# Patient Record
Sex: Male | Born: 1960 | Race: White | Hispanic: No | Marital: Married | Smoking: Never smoker
Health system: Southern US, Community
[De-identification: ages and names within clinical notes are randomized; demographics above are authoritative.]

## PROBLEM LIST (undated history)

## (undated) DIAGNOSIS — R310 Gross hematuria: Secondary | ICD-10-CM

## (undated) DIAGNOSIS — R3915 Urgency of urination: Secondary | ICD-10-CM

## (undated) DIAGNOSIS — Z6832 Body mass index (BMI) 32.0-32.9, adult: Secondary | ICD-10-CM

## (undated) DIAGNOSIS — I10 Essential (primary) hypertension: Secondary | ICD-10-CM

## (undated) DIAGNOSIS — R6882 Decreased libido: Secondary | ICD-10-CM

## (undated) DIAGNOSIS — R509 Fever, unspecified: Secondary | ICD-10-CM

## (undated) DIAGNOSIS — E871 Hypo-osmolality and hyponatremia: Secondary | ICD-10-CM

## (undated) DIAGNOSIS — R339 Retention of urine, unspecified: Secondary | ICD-10-CM

## (undated) DIAGNOSIS — R7309 Other abnormal glucose: Secondary | ICD-10-CM

## (undated) DIAGNOSIS — E538 Deficiency of other specified B group vitamins: Secondary | ICD-10-CM

## (undated) DIAGNOSIS — E291 Testicular hypofunction: Secondary | ICD-10-CM

## (undated) DIAGNOSIS — N138 Other obstructive and reflux uropathy: Secondary | ICD-10-CM

## (undated) DIAGNOSIS — R972 Elevated prostate specific antigen [PSA]: Secondary | ICD-10-CM

## (undated) DIAGNOSIS — R0609 Other forms of dyspnea: Secondary | ICD-10-CM

## (undated) DIAGNOSIS — R351 Nocturia: Secondary | ICD-10-CM

## (undated) DIAGNOSIS — R300 Dysuria: Secondary | ICD-10-CM

## (undated) DIAGNOSIS — G4733 Obstructive sleep apnea (adult) (pediatric): Secondary | ICD-10-CM

## (undated) DIAGNOSIS — N320 Bladder-neck obstruction: Secondary | ICD-10-CM

## (undated) DIAGNOSIS — R7303 Prediabetes: Secondary | ICD-10-CM

## (undated) DIAGNOSIS — R0602 Shortness of breath: Secondary | ICD-10-CM

## (undated) DIAGNOSIS — K760 Fatty (change of) liver, not elsewhere classified: Secondary | ICD-10-CM

## (undated) DIAGNOSIS — N401 Enlarged prostate with lower urinary tract symptoms: Secondary | ICD-10-CM

## (undated) DIAGNOSIS — R39198 Other difficulties with micturition: Secondary | ICD-10-CM

## (undated) HISTORY — DX: Hypo-osmolality and hyponatremia: E87.1

## (undated) HISTORY — DX: Elevated prostate specific antigen (PSA): R97.20

## (undated) HISTORY — DX: Fever, unspecified: R50.9

## (undated) HISTORY — DX: Prediabetes: R73.03

## (undated) HISTORY — DX: Fatty (change of) liver, not elsewhere classified: K76.0

## (undated) HISTORY — DX: Testicular hypofunction: E29.1

## (undated) HISTORY — PX: ILEOSTOMY: SHX1783

## (undated) HISTORY — DX: Bladder-neck obstruction: N32.0

## (undated) HISTORY — PX: APPENDECTOMY: SHX54

## (undated) HISTORY — DX: Benign prostatic hyperplasia with lower urinary tract symptoms: N40.1

## (undated) HISTORY — DX: Retention of urine, unspecified: R33.9

## (undated) HISTORY — DX: Other obstructive and reflux uropathy: N13.8

## (undated) HISTORY — DX: Other abnormal glucose: R73.09

## (undated) HISTORY — DX: Body mass index (bmi) 32.0-32.9, adult: Z68.32

## (undated) HISTORY — PX: COLECTOMY: SHX59

## (undated) HISTORY — DX: Gross hematuria: R31.0

## (undated) HISTORY — DX: Other difficulties with micturition: R39.198

## (undated) HISTORY — DX: Other forms of dyspnea: R06.09

## (undated) HISTORY — DX: Dysuria: R30.0

## (undated) HISTORY — PX: TRANSURETHRAL RESECTION OF PROSTATE: SHX73

## (undated) HISTORY — DX: Nocturia: R35.1

## (undated) HISTORY — DX: Shortness of breath: R06.02

## (undated) HISTORY — DX: Urgency of urination: R39.15

## (undated) HISTORY — DX: Decreased libido: R68.82

## (undated) HISTORY — PX: NASAL SINUS SURGERY: SHX719

## (undated) HISTORY — DX: Essential (primary) hypertension: I10

## (undated) HISTORY — DX: Deficiency of other specified B group vitamins: E53.8

## (undated) HISTORY — DX: Obstructive sleep apnea (adult) (pediatric): G47.33

---

## 2002-11-08 ENCOUNTER — Ambulatory Visit (HOSPITAL_COMMUNITY): Admission: RE | Admit: 2002-11-08 | Discharge: 2002-11-08 | Payer: Self-pay | Admitting: Otolaryngology

## 2004-09-16 ENCOUNTER — Ambulatory Visit: Payer: Self-pay | Admitting: Family Medicine

## 2004-09-22 ENCOUNTER — Ambulatory Visit: Payer: Self-pay | Admitting: Family Medicine

## 2004-12-14 ENCOUNTER — Ambulatory Visit: Payer: Self-pay | Admitting: Family Medicine

## 2005-01-14 ENCOUNTER — Ambulatory Visit: Payer: Self-pay | Admitting: Family Medicine

## 2005-01-25 ENCOUNTER — Ambulatory Visit: Payer: Self-pay | Admitting: Family Medicine

## 2005-02-08 ENCOUNTER — Ambulatory Visit: Payer: Self-pay | Admitting: Family Medicine

## 2005-05-11 ENCOUNTER — Ambulatory Visit: Payer: Self-pay | Admitting: Family Medicine

## 2005-10-17 ENCOUNTER — Ambulatory Visit: Payer: Self-pay | Admitting: Family Medicine

## 2011-11-14 DIAGNOSIS — R972 Elevated prostate specific antigen [PSA]: Secondary | ICD-10-CM

## 2011-11-14 HISTORY — DX: Elevated prostate specific antigen (PSA): R97.20

## 2012-10-08 DIAGNOSIS — R6882 Decreased libido: Secondary | ICD-10-CM

## 2012-10-08 HISTORY — DX: Decreased libido: R68.82

## 2013-04-16 DIAGNOSIS — E538 Deficiency of other specified B group vitamins: Secondary | ICD-10-CM

## 2013-04-16 DIAGNOSIS — E291 Testicular hypofunction: Secondary | ICD-10-CM

## 2013-04-16 DIAGNOSIS — R39198 Other difficulties with micturition: Secondary | ICD-10-CM | POA: Insufficient documentation

## 2013-04-16 DIAGNOSIS — R351 Nocturia: Secondary | ICD-10-CM

## 2013-04-16 HISTORY — DX: Deficiency of other specified B group vitamins: E53.8

## 2013-04-16 HISTORY — DX: Other difficulties with micturition: R39.198

## 2013-04-16 HISTORY — DX: Testicular hypofunction: E29.1

## 2013-04-16 HISTORY — DX: Nocturia: R35.1

## 2014-05-15 DIAGNOSIS — N320 Bladder-neck obstruction: Secondary | ICD-10-CM

## 2014-05-15 HISTORY — DX: Bladder-neck obstruction: N32.0

## 2014-11-20 DIAGNOSIS — N138 Other obstructive and reflux uropathy: Secondary | ICD-10-CM | POA: Insufficient documentation

## 2014-11-20 DIAGNOSIS — N401 Enlarged prostate with lower urinary tract symptoms: Secondary | ICD-10-CM

## 2014-11-20 HISTORY — DX: Other obstructive and reflux uropathy: N13.8

## 2016-03-24 DIAGNOSIS — G4733 Obstructive sleep apnea (adult) (pediatric): Secondary | ICD-10-CM

## 2016-03-24 HISTORY — DX: Obstructive sleep apnea (adult) (pediatric): G47.33

## 2016-05-10 DIAGNOSIS — R3915 Urgency of urination: Secondary | ICD-10-CM

## 2016-05-10 DIAGNOSIS — E871 Hypo-osmolality and hyponatremia: Secondary | ICD-10-CM

## 2016-05-10 DIAGNOSIS — R339 Retention of urine, unspecified: Secondary | ICD-10-CM

## 2016-05-10 HISTORY — DX: Retention of urine, unspecified: R33.9

## 2016-05-10 HISTORY — DX: Urgency of urination: R39.15

## 2016-05-10 HISTORY — DX: Hypo-osmolality and hyponatremia: E87.1

## 2016-12-29 ENCOUNTER — Other Ambulatory Visit: Payer: Self-pay | Admitting: Dermatology

## 2017-04-27 DIAGNOSIS — R7303 Prediabetes: Secondary | ICD-10-CM

## 2017-04-27 DIAGNOSIS — K760 Fatty (change of) liver, not elsewhere classified: Secondary | ICD-10-CM

## 2017-04-27 DIAGNOSIS — Z6832 Body mass index (BMI) 32.0-32.9, adult: Secondary | ICD-10-CM

## 2017-04-27 DIAGNOSIS — R0602 Shortness of breath: Secondary | ICD-10-CM

## 2017-04-27 HISTORY — DX: Prediabetes: R73.03

## 2017-04-27 HISTORY — DX: Shortness of breath: R06.02

## 2017-04-27 HISTORY — DX: Body mass index (BMI) 32.0-32.9, adult: Z68.32

## 2017-04-27 HISTORY — DX: Fatty (change of) liver, not elsewhere classified: K76.0

## 2017-05-11 ENCOUNTER — Other Ambulatory Visit: Payer: Self-pay | Admitting: Dermatology

## 2017-11-07 DIAGNOSIS — R509 Fever, unspecified: Secondary | ICD-10-CM

## 2017-11-07 DIAGNOSIS — R300 Dysuria: Secondary | ICD-10-CM

## 2017-11-07 DIAGNOSIS — R310 Gross hematuria: Secondary | ICD-10-CM

## 2017-11-07 HISTORY — DX: Fever, unspecified: R50.9

## 2017-11-07 HISTORY — DX: Dysuria: R30.0

## 2017-11-07 HISTORY — DX: Gross hematuria: R31.0

## 2017-11-10 DIAGNOSIS — R338 Other retention of urine: Secondary | ICD-10-CM

## 2017-11-10 DIAGNOSIS — N410 Acute prostatitis: Secondary | ICD-10-CM

## 2017-11-10 DIAGNOSIS — E871 Hypo-osmolality and hyponatremia: Secondary | ICD-10-CM

## 2017-11-10 DIAGNOSIS — R739 Hyperglycemia, unspecified: Secondary | ICD-10-CM

## 2017-11-10 DIAGNOSIS — E872 Acidosis: Secondary | ICD-10-CM | POA: Diagnosis not present

## 2017-11-10 DIAGNOSIS — D72829 Elevated white blood cell count, unspecified: Secondary | ICD-10-CM

## 2017-11-10 DIAGNOSIS — N183 Chronic kidney disease, stage 3 (moderate): Secondary | ICD-10-CM

## 2017-11-10 DIAGNOSIS — R310 Gross hematuria: Secondary | ICD-10-CM

## 2017-11-11 DIAGNOSIS — N183 Chronic kidney disease, stage 3 (moderate): Secondary | ICD-10-CM | POA: Diagnosis not present

## 2017-11-11 DIAGNOSIS — R310 Gross hematuria: Secondary | ICD-10-CM | POA: Diagnosis not present

## 2017-11-11 DIAGNOSIS — R338 Other retention of urine: Secondary | ICD-10-CM | POA: Diagnosis not present

## 2017-11-11 DIAGNOSIS — N410 Acute prostatitis: Secondary | ICD-10-CM | POA: Diagnosis not present

## 2018-02-28 DIAGNOSIS — R0609 Other forms of dyspnea: Secondary | ICD-10-CM

## 2018-02-28 DIAGNOSIS — R0600 Dyspnea, unspecified: Secondary | ICD-10-CM

## 2018-02-28 DIAGNOSIS — R7309 Other abnormal glucose: Secondary | ICD-10-CM

## 2018-02-28 HISTORY — DX: Dyspnea, unspecified: R06.00

## 2018-02-28 HISTORY — DX: Other forms of dyspnea: R06.09

## 2018-02-28 HISTORY — DX: Other abnormal glucose: R73.09

## 2018-03-14 DIAGNOSIS — I10 Essential (primary) hypertension: Secondary | ICD-10-CM | POA: Insufficient documentation

## 2018-03-14 HISTORY — DX: Essential (primary) hypertension: I10

## 2018-04-05 NOTE — Progress Notes (Signed)
Cardiology Office Note:    Date:  04/06/2018   ID:  James Kennedy, DOB 1961-03-26, MRN [id number]  PCP:  Leane Call, PA-C  Cardiologist:  Norman Herrlich, MD   Referring MD: Georga Kaufmann, MD  ASSESSMENT:    1. SOB (shortness of breath)   2. Essential hypertension   3. Obstructive sleep apnea   4. Arterial calcification    PLAN:    In order of problems listed above:  1. Risk of hypertension prediabetes and arterial calcification is having symptoms that seem to be typical anginal equivalent.  After discussion of the risk benefits and options of different noninvasive modalities and will refer for cardiac CTA.  I expect he will have significant CAD. 2. Stable continue current treatment 3. Stable is 1 of few people tolerates and benefits from CPAP wearing it greater than 4 hours per night and typically greater than 4 hours of the last hours of sleep 4. An increased cardiovascular risk if he has CAD he required initiation of lipid-lowering treatment  Next appointment   Medication Adjustments/Labs and Tests Ordered: Current medicines are reviewed at length with the patient today.  Concerns regarding medicines are outlined above.  No orders of the defined types were placed in this encounter.  No orders of the defined types were placed in this encounter.    Chief Complaint  Patient presents with  . New Patient (Initial Visit)  . Shortness of Breath    with exertion    History of Present Illness:    James Kennedy is a 57 y.o. male with hypertension prediabetes and sleep apnea who is being seen today for the evaluation of SOB at the request of Corum, Leeanne Mannan, MD. He has noted shortness of breath with activity such as climbing an incline the bleachers at the stadium.  Stress echo test is reportedly normal in 2016 and pulmonary function test as an outpatient have been normal.  Symptoms have prevented him from engaging in exercise program and at best described as severe  and limiting and now are starting to occur when he goes outside to do garden work.  He has no associated chest pain but it is clearly anginal in nature.  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  He has no underlying history of congenital or rheumatic heart disease.  His shortness of breath is anginal in nature is exertional causes him to need to rest for up to 5 minutes to recovery predictable but no associated chest pain he relates he is undergone multiple normal stress test in the past after discussion of modalities undergo cardiac CTA.Marland Kitchen He has no dye allergy and has normal renal function is an increased cardiovascular risk with hypertension prediabetes and arterial calcification and abdominal CT scan  Past Medical History:  Diagnosis Date  . Abnormal urinary stream 04/16/2013  . Benign prostatic hyperplasia with urinary obstruction 11/20/2014  . 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 BMI 32.0-32.9,adult 04/27/2017  . Dyspnea on exertion 02/28/2018  . Dysuria 11/07/2017  . Elevated hemoglobin A1c 02/28/2018  . Elevated prostate specific antigen (PSA) 11/14/2011  . Fatty liver 04/27/2017  . Fever 11/07/2017  . Gross hematuria 11/07/2017  . Hypertension 03/14/2018  . Hyponatremia 05/10/2016  . 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 Nocturia 04/16/2013  . Obstructive sleep apnea 03/24/2016  . Pre-diabetes 04/27/2017  . Reduced libido 10/08/2012  .  SOB (shortness of breath) 04/27/2017  . Urinary retention 05/10/2016  . Urinary urgency 05/10/2016  . Vitamin B12 deficiency (non anemic) 04/16/2013    Past Surgical History:  Procedure Laterality Date  . APPENDECTOMY    . COLECTOMY    . ILEOSTOMY    . NASAL SINUS SURGERY    . TRANSURETHRAL RESECTION OF PROSTATE      Current Medications: Current Meds  Medication Sig  . anastrozole (ARIMIDEX) 1 MG tablet TAKE ONE TABLET BY MOUTH EVERY DAY  .  cyanocobalamin (,VITAMIN B-12,) 1000 MCG/ML injection INJECT 1 ML INTO THE MUSCLE EVERY 14 DAYS  . desonide (DESOWEN) 0.05 % cream apply sparingly to affected area twice daily.  Marland Kitchen. dutasteride (AVODART) 0.5 MG capsule TAKE ONE CAPSULE BY MOUTH DAILY  . enalapril (VASOTEC) 5 MG tablet TAKE ONE TABLET BY MOUTH DAILY  . EPINEPHrine 0.3 mg/0.3 mL IJ SOAJ injection Inject 0.3 mg into the muscle as needed.  For anaphylaxis  . omeprazole (PRILOSEC) 40 MG capsule TAKE ONE CAPSULE BY MOUTH DAILY FOR REFLUX  . tadalafil (CIALIS) 5 MG tablet TAKE ONE TABLET EVERY DAY  . tamsulosin (FLOMAX) 0.4 MG CAPS capsule Take 0.4 mg by mouth daily.  . Testosterone 30 MG/ACT SOLN APPLY TWO PUMPS TO SKIN EVERY DAY IN THE MORNING  . triamcinolone (KENALOG) 0.1 % paste APPLY A THIN FILM TO AFFECTED AREA, PREFERABLY AT BEDTIME     Allergies:   Pentobarbital sodium and Iron   Social History   Socioeconomic History  . Marital status: Married    Spouse name: Not on file  . Number of children: Not on file  . Years of education: Not on file  . Highest education level: Not on file  Occupational History  . Not on file  Social Needs  . Financial resource strain: Not on file  . Food insecurity:    Worry: Not on file    Inability: Not on file  . Transportation needs:    Medical: Not on file    Non-medical: Not on file  Tobacco Use  . Smoking status: Never Smoker  . Smokeless tobacco: Never Used  Substance and Sexual Activity  . Alcohol use: Yes    Comment: rare  . Drug use: Never  . Sexual activity: Not on file  Lifestyle  . Physical activity:    Days per week: Not on file    Minutes per session: Not on file  . Stress: Not on file  Relationships  . Social connections:    Talks on phone: Not on file    Gets together: Not on file    Attends religious service: Not on file    Active member of club or organization: Not on file    Attends meetings of clubs or organizations: Not on file    Relationship status:  Not on file  Other Topics Concern  . Not on file  Social History Narrative  . Not on file     Family History: The patient's family history includes Alzheimer's disease in his mother; Cancer in his mother; Diabetes in his mother; Heart attack in his father; Heart disease in his father; Hypertension in his mother.  ROS:   Review of Systems  Constitution: Positive for weight loss.  HENT: Negative.   Eyes: Negative.   Cardiovascular: Positive for dyspnea on exertion.  Respiratory: Positive for shortness of breath.   Hematologic/Lymphatic: Bruises/bleeds easily.  Skin: Negative.   Musculoskeletal: Negative.   Gastrointestinal: Negative.   Genitourinary: Negative.  Neurological: Positive for dizziness.  Psychiatric/Behavioral: Negative.   Allergic/Immunologic: Negative.    Please see the history of present illness.     All other systems reviewed and are negative.  EKGs/Labs/Other Studies Reviewed:    The following studies were reviewed today:  EKG 03/01/2018 sinus rhythm normal 67 BPM EKG:  EKG is  ordered today.  The ekg ordered today demonstrates SRTH normal 02/28/2018 hemoglobin A1c 6.2 CMP is normal except for GFR 64 cc creatinine 1.23 Recent Labs: 10/31/2017 hemoglobin normal 14.2 but he is microcytic. No results found for requested labs within last 8760 hours.  Recent Lipid Panel   04/26/2017 cholesterol 163 HDL 43 LDL 100 H No results found for: CHOL, TRIG, HDL, CHOLHDL, VLDL, LDLCALC, LDLDIRECT  Physical Exam:    VS:  BP 120/88 (BP Location: Left Arm, Patient Position: Sitting, Cuff Size: Large)   Pulse 62   Ht 5\' 10"  (1.778 m)   Wt 229 lb (103.9 kg)   SpO2 98%   BMI 32.86 kg/m     Wt Readings from Last 3 Encounters:  04/06/18 229 lb (103.9 kg)     GEN:  Well nourished, well developed in no acute distress HEENT: Normal NECK: No JVD; No carotid bruits LYMPHATICS: No lymphadenopathy CARDIAC: RRR, no murmurs, rubs, gallops RESPIRATORY:  Clear to auscultation  without rales, wheezing or rhonchi  ABDOMEN: Soft, non-tender, non-distended MUSCULOSKELETAL:  No edema; No deformity  SKIN: Warm and dry NEUROLOGIC:  Alert and oriented x 3 PSYCHIATRIC:  Normal affect     Signed, Norman Herrlich, MD  04/06/2018 8:40 AM    [facility]

## 2018-04-06 ENCOUNTER — Encounter (INDEPENDENT_AMBULATORY_CARE_PROVIDER_SITE_OTHER): Payer: Self-pay

## 2018-04-06 ENCOUNTER — Ambulatory Visit: Payer: BC Managed Care – PPO | Admitting: Cardiology

## 2018-04-06 ENCOUNTER — Encounter: Payer: Self-pay | Admitting: Cardiology

## 2018-04-06 VITALS — BP 120/88 | HR 62 | Ht 70.0 in | Wt 229.0 lb

## 2018-04-06 DIAGNOSIS — R0602 Shortness of breath: Secondary | ICD-10-CM

## 2018-04-06 DIAGNOSIS — I7090 Unspecified atherosclerosis: Secondary | ICD-10-CM | POA: Diagnosis not present

## 2018-04-06 DIAGNOSIS — G4733 Obstructive sleep apnea (adult) (pediatric): Secondary | ICD-10-CM | POA: Diagnosis not present

## 2018-04-06 DIAGNOSIS — I10 Essential (primary) hypertension: Secondary | ICD-10-CM | POA: Diagnosis not present

## 2018-04-06 LAB — PRO B NATRIURETIC PEPTIDE: NT-PRO BNP: 13 pg/mL (ref 0–210)

## 2018-04-06 MED ORDER — METOPROLOL TARTRATE 50 MG PO TABS: 50.0000 mg | ORAL_TABLET | Freq: Once | ORAL | 0 refills | Status: DC

## 2018-04-06 NOTE — Patient Instructions (Signed)
Medication Instructions:  Your physician has recommended you make the following change in your medication:  TAKE 1 dose of metoprolol 50 mg one hour before your cardiac imaging, this is a one time dose.   Labwork: Your physician recommends that you have the following labs drawn: BMP needed at least 2 days before your cardiac imaging is performed. BNP will be done today.  Testing/Procedures: Your physician has requested that you have an echocardiogram. Echocardiography is a painless test that uses sound waves to create images of your heart. It provides your doctor with information about the size and shape of your heart and how well your heart's chambers and valves are working. This procedure takes approximately one hour. There are no restrictions for this procedure.  Please [facility] of [facility] (you will be called with an appointment date and time once insurance has approved)  (30-45 minutes prior to test start time)  [facility] [address] [place], [place] [address] [phone]  Proceed to the [facility] Radiology Department (First Floor).  Please follow these instructions carefully (unless otherwise directed):  Hold all erectile dysfunction medications at least 48 hours prior to test.  On the Night Before the Test: . Drink plenty of water. . Do not consume any caffeinated/decaffeinated beverages or chocolate 12 hours prior to your test. . Do not take any antihistamines 12 hours prior to your test.  On the Day of the Test: . Drink plenty of water. Do not drink any water within one hour of the test. . Do not eat any food 4 hours prior to the test. . You may take your regular medications prior to the test. . IF NOT ON A BETA BLOCKER - Take 50 mg of lopressor (metoprolol) one hour before the test.  After the Test: . Drink plenty of water. . After receiving IV contrast, you may experience a mild flushed feeling. This is  normal. . On occasion, you may experience a mild rash up to 24 hours after the test. This is not dangerous. If this occurs, you can take Benadryl 25 mg and increase your fluid intake. . If you experience trouble breathing, this can be serious. If it is severe call 911 IMMEDIATELY. If it is mild, please call our office. . If you take any of these medications: Glipizide/Metformin, Avandament, Glucavance, please do not take 48 hours after completing test.  Follow-Up: Your physician recommends that you schedule a follow-up appointment in: 6 weeks  Any Other Special Instructions Will Be Listed Below (If Applicable).     If you need a refill on your cardiac medications before your next appointment, please call your pharmacy.   [facility]  Garey HamAshley A, RN, BSN

## 2018-04-10 ENCOUNTER — Ambulatory Visit (HOSPITAL_BASED_OUTPATIENT_CLINIC_OR_DEPARTMENT_OTHER)
Admission: RE | Admit: 2018-04-10 | Discharge: 2018-04-10 | Disposition: A | Discharge status: Home / Self Care | Payer: BC Managed Care – PPO | Source: Ambulatory Visit | Attending: Cardiology | Admitting: Cardiology

## 2018-04-10 DIAGNOSIS — I7789 Other specified disorders of arteries and arterioles: Secondary | ICD-10-CM | POA: Insufficient documentation

## 2018-04-10 DIAGNOSIS — R0602 Shortness of breath: Secondary | ICD-10-CM | POA: Diagnosis not present

## 2018-04-10 DIAGNOSIS — I10 Essential (primary) hypertension: Secondary | ICD-10-CM | POA: Diagnosis not present

## 2018-04-10 NOTE — Progress Notes (Signed)
  Echocardiogram 2D Echocardiogram has been performed.  Dorothey BasemanReel, Arnetta Odeh M 04/10/2018, 3:52 PM

## 2018-05-01 ENCOUNTER — Other Ambulatory Visit: Payer: BC Managed Care – PPO

## 2018-05-08 ENCOUNTER — Ambulatory Visit (HOSPITAL_COMMUNITY)
Admission: RE | Admit: 2018-05-08 | Discharge: 2018-05-08 | Disposition: A | Discharge status: Home / Self Care | Payer: BC Managed Care – PPO | Source: Ambulatory Visit | Attending: Cardiology | Admitting: Cardiology

## 2018-05-08 ENCOUNTER — Ambulatory Visit (HOSPITAL_COMMUNITY): Payer: BC Managed Care – PPO

## 2018-05-08 DIAGNOSIS — R0602 Shortness of breath: Secondary | ICD-10-CM | POA: Diagnosis present

## 2018-05-08 DIAGNOSIS — J984 Other disorders of lung: Secondary | ICD-10-CM | POA: Insufficient documentation

## 2018-05-08 DIAGNOSIS — R079 Chest pain, unspecified: Secondary | ICD-10-CM | POA: Diagnosis not present

## 2018-05-08 MED ORDER — NITROGLYCERIN 0.4 MG SL SUBL
SUBLINGUAL_TABLET | SUBLINGUAL | Status: AC
  Filled 2018-05-08: qty 2

## 2018-05-08 MED ORDER — IOPAMIDOL (ISOVUE-370) INJECTION 76%
100.0000 mL | Freq: Once | INTRAVENOUS | Status: AC | PRN
  Administered 2018-05-08: 100 mL via INTRAVENOUS

## 2018-05-08 MED ORDER — NITROGLYCERIN 0.4 MG SL SUBL
0.8000 mg | SUBLINGUAL_TABLET | Freq: Once | SUBLINGUAL | Status: AC
  Administered 2018-05-08: 0.8 mg via SUBLINGUAL
  Filled 2018-05-08: qty 25

## 2018-05-17 NOTE — Progress Notes (Signed)
Cardiology Office Note:    Date:  05/18/2018   ID:  James Kennedy, DOB 1961/08/28, MRN [id number]  PCP:  Dorette Grateorum, Lisa, MD  Cardiologist:  Norman HerrlichBrian Munley, MD    Referring MD: Leane CallNodal, Jr Reinaldo, PA-C    ASSESSMENT:    1. Essential hypertension   2. SOB (shortness of breath)    PLAN:    In order of problems listed above:  1. Stable blood pressure at target continue ACE inhibitor 2. Extensive cardiac evaluation shows no evidence of cardiomyopathy heart failure CAD he prefers to start a regular exercise program and defer pulmonary testing at this time   Next appointment: As needed   Medication Adjustments/Labs and Tests Ordered: Current medicines are reviewed at length with the patient today.  Concerns regarding medicines are outlined above.  No orders of the defined types were placed in this encounter.  No orders of the defined types were placed in this encounter.   Chief Complaint  Patient presents with  . Follow-up    History of Present Illness:    James Kennedy is a 57 y.o. male with a hx of hypertension prediabetes and sleep apnea   last seen 04/06/18 for exertional Sob exertional... Compliance with diet, lifestyle and medications: Yes  He is encouraged with his echocardiogram normal for age and coronary CTA showing no calcification of the coronary arteries or stenosis. He does have exertional shortness of breath he wheezes at times we discussed whether he should undergo pulmonary evaluation with PFTs imaging of the chest but he prefers to start an exercise program and to see the shortness of breath improves.  He has had no chest pain palpitation or syncope shortness of breath is for more than usual activities such as climbing stairs he is not released recently Past Medical History:  Diagnosis Date  . Abnormal urinary stream 04/16/2013  . Benign prostatic hyperplasia with urinary obstruction 11/20/2014  . 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 BMI 32.0-32.9,adult  04/27/2017  . Dyspnea on exertion 02/28/2018  . Dysuria 11/07/2017  . Elevated hemoglobin A1c 02/28/2018  . Elevated prostate specific antigen (PSA) 11/14/2011  . Fatty liver 04/27/2017  . Fever 11/07/2017  . Gross hematuria 11/07/2017  . Hypertension 03/14/2018  . Hyponatremia 05/10/2016  . 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 Nocturia 04/16/2013  . Obstructive sleep apnea 03/24/2016  . Pre-diabetes 04/27/2017  . Reduced libido 10/08/2012  . SOB (shortness of breath) 04/27/2017  . Urinary retention 05/10/2016  . Urinary urgency 05/10/2016  . Vitamin B12 deficiency (non anemic) 04/16/2013    Past Surgical History:  Procedure Laterality Date  . APPENDECTOMY    . COLECTOMY    . ILEOSTOMY    . NASAL SINUS SURGERY    . TRANSURETHRAL RESECTION OF PROSTATE      Current Medications: Current Meds  Medication Sig  . anastrozole (ARIMIDEX) 1 MG tablet TAKE ONE TABLET BY MOUTH EVERY DAY  . cyanocobalamin (,VITAMIN B-12,) 1000 MCG/ML injection INJECT 1 ML INTO THE MUSCLE EVERY 14 DAYS  . desonide (DESOWEN) 0.05 % cream apply sparingly to affected area twice daily.  Marland Kitchen. dutasteride (AVODART) 0.5 MG capsule TAKE ONE CAPSULE BY MOUTH DAILY  . enalapril (VASOTEC) 5 MG tablet TAKE ONE TABLET BY MOUTH DAILY  . EPINEPHrine 0.3 mg/0.3 mL IJ SOAJ injection Inject 0.3 mg into the muscle as needed.  For anaphylaxis  . omeprazole (PRILOSEC) 40 MG capsule TAKE ONE CAPSULE BY MOUTH DAILY FOR REFLUX  . tadalafil (CIALIS) 5 MG tablet TAKE ONE  TABLET EVERY DAY  . tamsulosin (FLOMAX) 0.4 MG CAPS capsule Take 0.4 mg by mouth daily.  . Testosterone 30 MG/ACT SOLN APPLY TWO PUMPS TO SKIN EVERY DAY IN THE MORNING  . triamcinolone (KENALOG) 0.1 % paste APPLY A THIN FILM TO AFFECTED AREA, PREFERABLY AT BEDTIME     Allergies:   Pentobarbital sodium and Iron   Social History   Socioeconomic History  . Marital status: Married    Spouse name: Not on file  . Number of children: Not on file  . Years of education: Not on file  .  Highest education level: Not on file  Occupational History  . Not on file  Social Needs  . Financial resource strain: Not on file  . Food insecurity:    Worry: Not on file    Inability: Not on file  . Transportation needs:    Medical: Not on file    Non-medical: Not on file  Tobacco Use  . Smoking status: Never Smoker  . Smokeless tobacco: Never Used  Substance and Sexual Activity  . Alcohol use: Yes    Comment: rare  . Drug use: Never  . Sexual activity: Not on file  Lifestyle  . Physical activity:    Days per week: Not on file    Minutes per session: Not on file  . Stress: Not on file  Relationships  . Social connections:    Talks on phone: Not on file    Gets together: Not on file    Attends religious service: Not on file    Active member of club or organization: Not on file    Attends meetings of clubs or organizations: Not on file    Relationship status: Not on file  Other Topics Concern  . Not on file  Social History Narrative  . Not on file     Family History: The patient's family history includes Alzheimer's disease in his mother; Cancer in his mother; Diabetes in his mother; Heart attack in his father; Heart disease in his father; Hypertension in his mother. ROS:   Please see the history of present illness.    All other systems reviewed and are negative.  EKGs/Labs/Other Studies Reviewed:    The following studies were reviewed today  Cardiac CTA showed a calcium score of 0 and coronary artery disease was not present Echocardiogram performed 04/10/2018 shows ejection fraction of 55 to 60% and normal diastolic function there is no valvular abnormality  Recent Labs: 04/06/2018: NT-Pro BNP 13  Recent Lipid Panel No results found for: CHOL, TRIG, HDL, CHOLHDL, VLDL, LDLCALC, LDLDIRECT  Physical Exam:    VS:  BP 136/88 (BP Location: Right Arm, Patient Position: Sitting, Cuff Size: Large)   Pulse (!) 54   Ht 5\' 10"  (1.778 m)   Wt 233 lb (105.7 kg)   SpO2  98%   BMI 33.43 kg/m     Wt Readings from Last 3 Encounters:  05/18/18 233 lb (105.7 kg)  04/06/18 229 lb (103.9 kg)     GEN:  Well nourished, well developed in no acute distress HEENT: Normal NECK: No JVD; No carotid bruits LYMPHATICS: No lymphadenopathy CARDIAC: RRR, no murmurs, rubs, gallops RESPIRATORY:  Clear to auscultation without rales, wheezing or rhonchi  ABDOMEN: Soft, non-tender, non-distended MUSCULOSKELETAL:  No edema; No deformity  SKIN: Warm and dry NEUROLOGIC:  Alert and oriented x 3 PSYCHIATRIC:  Normal affect    Signed, Norman Herrlich, MD  05/18/2018 9:24 AM    [facility]

## 2018-05-18 ENCOUNTER — Encounter: Payer: Self-pay | Admitting: Cardiology

## 2018-05-18 ENCOUNTER — Ambulatory Visit: Payer: BC Managed Care – PPO | Admitting: Cardiology

## 2018-05-18 VITALS — BP 136/88 | HR 54 | Ht 70.0 in | Wt 233.0 lb

## 2018-05-18 DIAGNOSIS — I10 Essential (primary) hypertension: Secondary | ICD-10-CM

## 2018-05-18 DIAGNOSIS — R0602 Shortness of breath: Secondary | ICD-10-CM | POA: Diagnosis not present

## 2018-05-18 NOTE — Patient Instructions (Addendum)
Medication Instructions:  Your physician recommends that you continue on your current medications as directed. Please refer to the Current Medication list given to you today.   Labwork: NONE  Testing/Procedures: NONE  Follow-Up: Your physician recommends that you schedule a follow-up appointment as needed or if symptoms worsen or fail to improve.  Any Other Special Instructions Will Be Listed Below (If Applicable).     If you need a refill on your cardiac medications before your next appointment, please call your pharmacy.   

## 2019-06-18 ENCOUNTER — Other Ambulatory Visit: Payer: Self-pay

## 2019-06-18 ENCOUNTER — Ambulatory Visit (INDEPENDENT_AMBULATORY_CARE_PROVIDER_SITE_OTHER): Payer: BC Managed Care – PPO | Admitting: Allergy

## 2019-06-18 ENCOUNTER — Encounter: Payer: Self-pay | Admitting: Allergy

## 2019-06-18 VITALS — BP 120/86 | HR 60 | Temp 98.4°F | Resp 14 | Ht 70.0 in | Wt 199.0 lb

## 2019-06-18 DIAGNOSIS — J3089 Other allergic rhinitis: Secondary | ICD-10-CM

## 2019-06-18 DIAGNOSIS — L299 Pruritus, unspecified: Secondary | ICD-10-CM | POA: Diagnosis not present

## 2019-06-18 MED ORDER — HYDROXYZINE PAMOATE 25 MG PO CAPS: ORAL_CAPSULE | ORAL | 5 refills | Status: AC

## 2019-06-18 MED ORDER — EUCRISA 2 % EX OINT: 1.0000 "application " | TOPICAL_OINTMENT | Freq: Two times a day (BID) | CUTANEOUS | 5 refills | Status: AC | PRN

## 2019-06-18 NOTE — Progress Notes (Signed)
New Patient Note  RE: [name]: [id number] DOB: 1961-01-25 Date of Office Visit: 06/18/2019  Referring provider: No ref. provider found Primary care provider: Nodal, Alphonzo Dublin, PA-C  Chief Complaint: itching  History of present illness: James Kennedy is a 58 y.o. male presenting today for evaluation of pruritus.    He reports a lot of itching that is quite severe to the point that he wants to "scratch off his skin".  Itch is keeping him him up at night.  Mostly on his arms but also has occurred on face and legs.   He states he does not really notice a rash but does get red after scratching.  Itching has been ongoing for couple years now but is getting worse.  His PCP recommended he try Claritin which he takes when he starts itching but does not feel it helps.  He also has tried benadryl which also didn't seem to help much.  He states the sun may make the itch worse thus he wears long sleeves and pants and hat when he mows the lawn.  Denies warm/hot water makes itch worse.  He denies itch is worse any times of the year.  He denies any foods that make him itchier.  He denies any bites/stings.   No change in detergents/body products. No new foods, no new medications prior to onset.  No recollection of preceding illness.    He sees dermatology yearly for regular skin checks.  Dermatologist recommended desonide which hasn't helped.    Chicken wings and sweet tea with lemon from [employer] many years ago he reports would cause facial blotchiness.  Hot sauce has caused sometimes lip swelling.   Denies history of asthma or eczema.  He does report chronic nasal congestion.  He states he has injuries to his nose as a child and had sinus surgery about 15 years ago. He has use flonase in the past for nasal congestion but reports was not effective.    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    He reports having an intentional 30lb weight loss.  He  does have sleep apnea and uses CPAP at night.  He does state his family took in a stray dog around June and it does come inside the home.    Review of systems: Review of Systems  Constitutional: Positive for weight loss. Negative for chills, fever and malaise/fatigue.  HENT: Positive for congestion. Negative for ear discharge, ear pain, nosebleeds, sinus pain and sore throat.   Eyes: Negative for pain, discharge and redness.  Respiratory: Negative for cough, shortness of breath and wheezing.   Cardiovascular: Negative for chest pain.  Gastrointestinal: Negative for abdominal pain, constipation, diarrhea, heartburn, nausea and vomiting.  Musculoskeletal: Negative for joint pain.  Skin: Positive for itching. Negative for rash.  Neurological: Negative for headaches.    All other systems negative unless noted above in HPI  Past medical history: Past Medical History:  Diagnosis Date  . Abnormal urinary stream 04/16/2013  . Benign prostatic hyperplasia with urinary obstruction 11/20/2014  . 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 BMI 32.0-32.9,adult 04/27/2017  . Dyspnea on exertion 02/28/2018  . Dysuria 11/07/2017  . Elevated hemoglobin A1c 02/28/2018  . Elevated prostate specific antigen (PSA) 11/14/2011  . Fatty liver 04/27/2017  . Fever 11/07/2017  . Gross hematuria 11/07/2017  . Hypertension 03/14/2018  . Hyponatremia 05/10/2016  . 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 Nocturia  04/16/2013  . Obstructive sleep apnea 03/24/2016  . Pre-diabetes 04/27/2017  . Reduced libido 10/08/2012  . SOB (shortness of breath) 04/27/2017  . Urinary retention 05/10/2016  . Urinary urgency 05/10/2016  . Vitamin B12 deficiency (non anemic) 04/16/2013    Past surgical history: Past Surgical History:  Procedure Laterality Date  . APPENDECTOMY    . COLECTOMY    . ILEOSTOMY    . NASAL SINUS SURGERY    . TRANSURETHRAL RESECTION OF PROSTATE      Family history:  Family History  Problem Relation Age of Onset  . Diabetes  Mother   . Alzheimer's disease Mother   . Cancer Mother   . Hypertension Mother   . Heart disease Father   . Heart attack Father     Social history: Lives in a home without carpeting with electric heating and central cooling.  No concern for water damage, mildew or roaches in the home.  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  Denies smoking history.  Medication List: Allergies as of 06/18/2019      Reactions   Pentobarbital Sodium Other (See Comments)   hallucinations Other reaction(s): Mental Status Changes (intolerance) hallucinations   Iron Itching, Rash   I.V. iron      Medication List       Accurate as of June 18, 2019 11:02 AM. If you have any questions, ask your nurse or doctor.        anastrozole 1 MG tablet Commonly known as: ARIMIDEX TAKE ONE TABLET BY MOUTH EVERY DAY   Cialis 5 MG tablet Generic drug: tadalafil TAKE ONE TABLET EVERY DAY   cyanocobalamin 1000 MCG/ML injection Commonly known as: (VITAMIN B-12) INJECT 1 ML INTO THE MUSCLE EVERY 14 DAYS   desonide 0.05 % cream Commonly known as: DESOWEN apply sparingly to affected area twice daily.   dutasteride 0.5 MG capsule Commonly known as: AVODART TAKE ONE CAPSULE BY MOUTH DAILY   enalapril 5 MG tablet Commonly known as: VASOTEC TAKE ONE TABLET BY MOUTH DAILY   EPINEPHrine 0.3 mg/0.3 mL Soaj injection Commonly known as: EPI-PEN Inject 0.3 mg into the muscle as needed.  For anaphylaxis   omeprazole 40 MG capsule Commonly known as: PRILOSEC TAKE ONE CAPSULE BY MOUTH DAILY FOR REFLUX   SAW PALMETTO PO Take by mouth.   tamsulosin 0.4 MG Caps capsule Commonly known as: FLOMAX Take 0.4 mg by mouth daily.   Testosterone 30 MG/ACT Soln APPLY TWO PUMPS TO SKIN EVERY DAY IN THE MORNING   triamcinolone 0.1 % paste Commonly known as: KENALOG APPLY A THIN FILM TO AFFECTED AREA, PREFERABLY AT BEDTIME       Known medication allergies: Allergies  Allergen  Reactions  . Pentobarbital Sodium Other (See Comments)    hallucinations Other reaction(s): Mental Status Changes (intolerance) hallucinations  . Iron Itching and Rash    I.V. iron     Physical examination: Blood pressure 120/86, pulse 60, temperature 98.4 F (36.9 C), temperature source Temporal, resp. rate 14, height 5\' 10"  (1.778 m), weight 199 lb (90.3 kg), SpO2 97 %.  General: Alert, interactive, in no acute distress. HEENT: PERRLA, TMs pearly gray, turbinates minimally edematous without discharge, post-pharynx non erythematous. Neck: Supple without lymphadenopathy. Lungs: Clear to auscultation without wheezing, rhonchi or rales. {no increased work of breathing. CV: Normal S1, S2 without murmurs. Abdomen: Nondistended, nontender. Skin: Warm and dry, without lesions or rashes. Extremities:  No clubbing, cyanosis or edema. Neuro:   Grossly intact.  Diagnositics/Labs: Labs: Basic Metabolic Panel (03/19/2019 8:48 AM EDT) Component Value Ref Range  Sodium 139 135 - 146 MMOL/L  Potassium 3.7 3.5 - 5.3 MMOL/L  Chloride 104 98 - 110 MMOL/L  CO2 24 23 - 30 MMOL/L  BUN 12 8 - 24 MG/DL  Glucose 254 (H) 70 - 99 MG/DL  Creatinine 9.82 6.41 - 1.50 MG/DL  Calcium 9.9 8.5 - 58.3 MG/DL  Anion Gap 11 4 - 14 MMOL/L  Est. GFR Non-African American 60Comment: GFR estimated by CKD-EPI equations, reportable up to 90 ML/MIN/1.73 M*2 >=60 ML/MIN/1.73 M*2     Allergy testing: environmental allergy skin prick testing is negative with positive histamine controls.  Intradermal testing is positive to mold mix 2,3,4 and dog.  Allergy testing results were read and interpreted by provider, documented by clinical staff.   Assessment and plan: Pruritus without dermatitis  - environmental allergy skin testing is positive to molds and dog  - allergen avoidance measures discussed/handouts provided  - common food allergy skin testing is negative  - change Claritin to either Allegra 180mg , Zyrtec 10mg   or Xyzal 5mg  daily  - use Hydroxyzine 25mg  as needed for nighttime itch control  - try Eucrisa 1 application twice a day as needed   - daily moisturization is key.  Can try over-the-counter Sarna brand moisturizer   - will obtain additional labwork including CBC w diff, TSH, LFTs  Allergic rhinitis  - allergy testing as above  - recommend use of nasal saline rinses to help clean/flush out the sinus passage  - can try Nasacort or Rhinocort which are both over-the-counter for nasal congestion relief  Follow-up 2-3 months or sooner if needed  I appreciate the opportunity to take part in Helton's care. Please do not hesitate to contact me with questions.  Sincerely,   Margo Aye, MD Allergy/Immunology [facility]

## 2019-06-18 NOTE — Patient Instructions (Addendum)
Itching  - environmental allergy skin testing is positive to molds and dog  - allergen avoidance measures discussed/handouts provided  - common food allergy skin testing is negative  - change Claritin to either Allegra 180mg , Zyrtec 10mg  or Xyzal 5mg  daily  - use Hydroxyzine 25mg  as needed for nighttime itch control  - try Eucrisa 1 application twice a day as needed   - daily moisturization is key.  Can try over-the-counter Sarna brand moisturizer   - will obtain additional labwork including CBC w diff, TSH, LFTs  Nasal congestion   - allergy testing as above  - recommend use of nasal saline rinses to help clean/flush out the sinus passage  - can try Nasacort or Rhinocort which are both over-the-counter for nasal congestion relief  Follow-up 2-3 months or sooner if needed

## 2019-06-19 ENCOUNTER — Other Ambulatory Visit: Payer: Self-pay

## 2019-06-19 MED ORDER — PIMECROLIMUS 1 % EX CREA: TOPICAL_CREAM | CUTANEOUS | 3 refills | Status: DC

## 2019-06-20 ENCOUNTER — Telehealth: Payer: Self-pay

## 2019-06-20 NOTE — Telephone Encounter (Signed)
geez well I think we are stuck.   He does not have atopic dermatitis, psoriasis or vitiligo.    He only has pruritus which is hard to get any topical therapies approved.   If we ever get any Eucrisa samples in we could have him try the samples to even see if it helps subsides his itch.

## 2019-06-20 NOTE — Telephone Encounter (Signed)
Received fax from [facility] stating that Elidel needed a PA.  Did PA on [email or web address] and received a denial from insurance stating "You do not meet the requirements of your plan. Your plan approved Elidel criteria covers this drug when you have any of these conditions: - Mild to moderate atopic dermatitis (eczema) - Psoriasis on the face, genitals, or skin folds - Vitiligo on the head or neck Your request has been denied based on the information we have." Dr. Nelva Bush please advise with next step.

## 2019-06-21 NOTE — Telephone Encounter (Signed)
I will look into obtaining samples on Monday.

## 2019-06-24 NOTE — Telephone Encounter (Signed)
James Kennedy called back and wanted to inform us that he received 2 denial letters for the medications we tried to get approved. I explained that we would try to see if we could get him samples, and he voiced understanding.

## 2019-07-01 NOTE — Telephone Encounter (Signed)
James Kennedy informed that we finally obtained a very small amount of Eucrisa for him to try. It will be up front for him to pick up.

## 2019-07-25 IMAGING — CT CT HEART MORP W/ CTA COR W/ SCORE W/ CA W/CM &/OR W/O CM
4 of 7 series · 8 of 20 positions shown, 9 images · IV contrast (APPLIED)
Comparison: None.

CLINICAL DATA: Chest pain

EXAM:
Cardiac CTA
MEDICATIONS:
Sub lingual nitro. 4mg x 2 and lopressor 5mg IV
TECHNIQUE: The patient was scanned on a Siemens [REDACTED]ice scanner. Gantry
rotation speed was 250 msecs. Collimation was 0.8 mm. A 100 kV
prospective scan was triggered in the ascending thoracic aorta at
35-75% of the R-R interval. Average HR during the scan was 60 bpm.
The 3D data set was interpreted on a dedicated work station using
MPR, MIP and VRT modes. A total of 80cc of contrast was used.

[Series 6: best diast 70 % · axial · 0.39mm/px · z∈[+1021,+1065]mm · 2 of 331 slices shown]
[im 111/331  vessel]
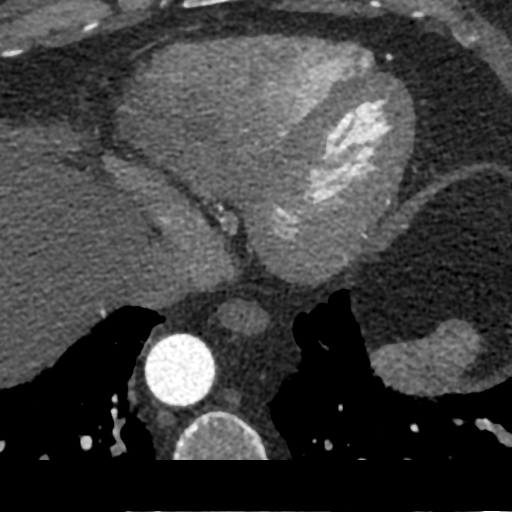
[im 221/331  vessel]
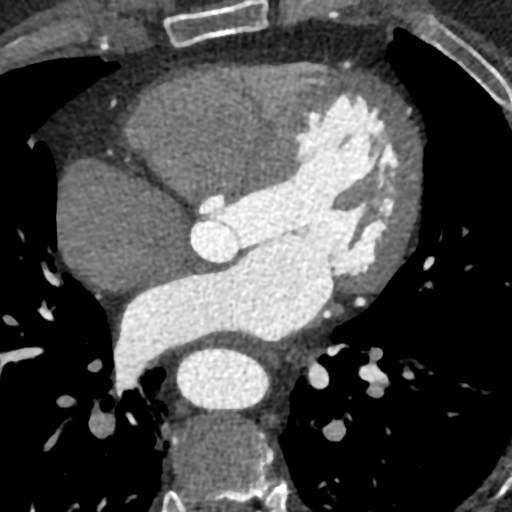

[Series 7: best syst · axial · 0.39mm/px · z∈[+1021,+1065]mm · 2 of 331 slices shown, 3 images]
[im 111/331  vessel]
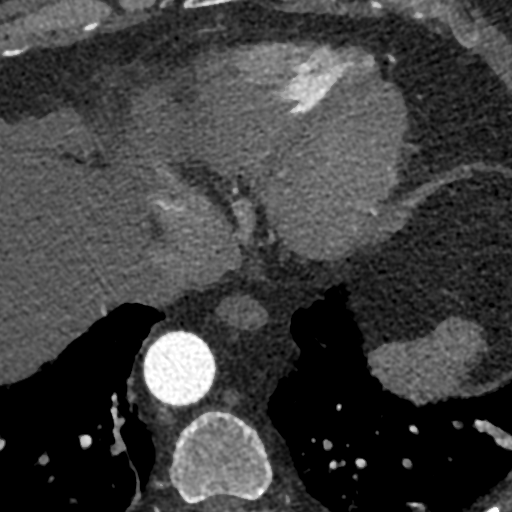
[im 111/331  lung]
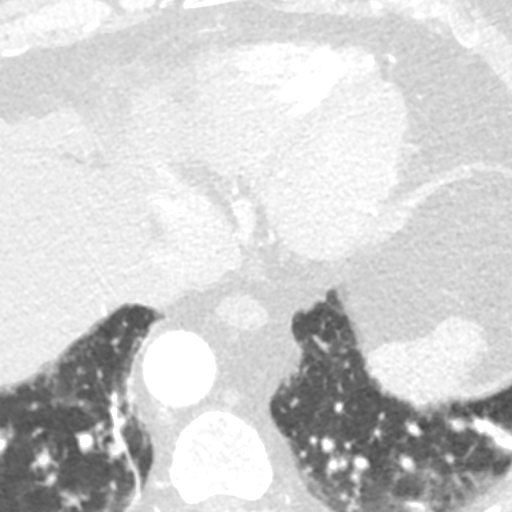
[im 221/331  vessel]
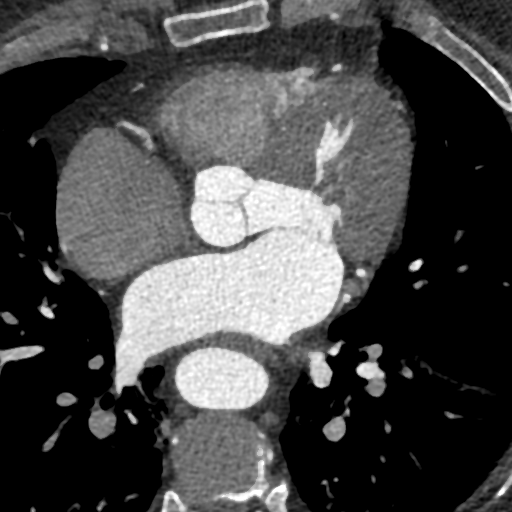

[Series 8: ts diast sharp 70 % · axial · 0.39mm/px · z∈[+1021,+1065]mm · 2 of 331 slices shown]
[im 111/331  lung]
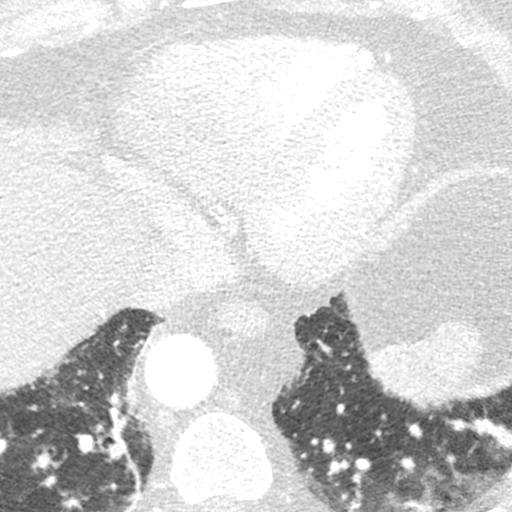
[im 221/331  lung]
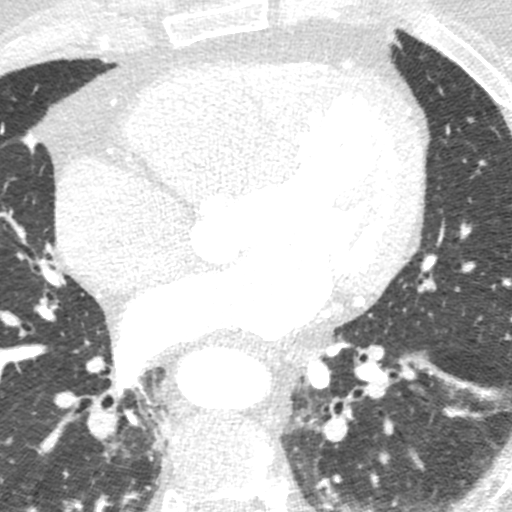

[Series 9: ts syst sharp · axial · 0.39mm/px · z∈[+1021,+1065]mm · 2 of 331 slices shown]
[im 111/331  lung]
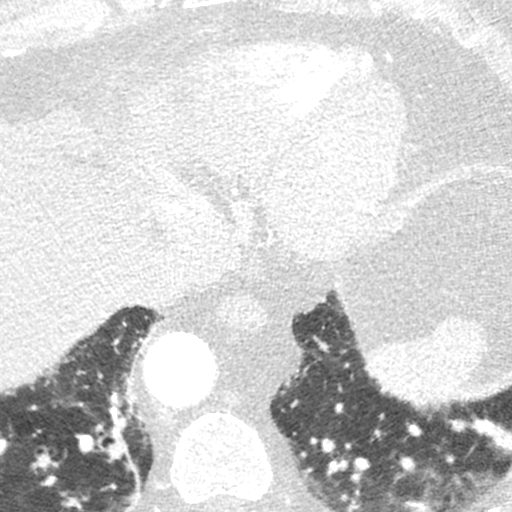
[im 221/331  lung]
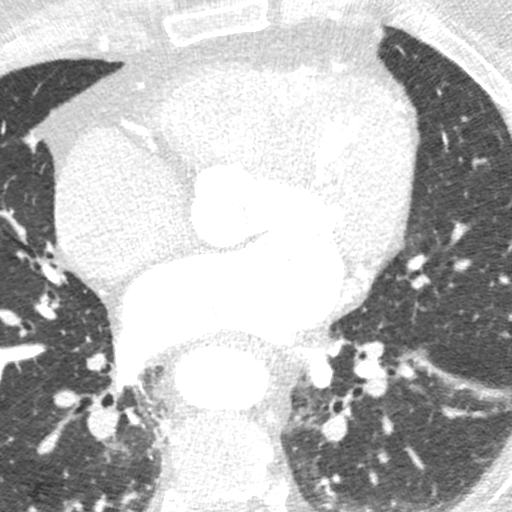

[8 of 20 positions shown; findings below may reference images not displayed]

FINDINGS: Non-cardiac: See separate report from [REDACTED].

Calcium Score: 0 Agatston units.

Coronary Arteries: Left dominant with no anomalies

LM: No plaque or stenosis.

LAD system: No plaque or stenosis.

Circumflex system: Small ramus present. Large LCx providing
left-sided PDA. No plaque or stenosis.

RCA system: Small, nondominant vessel.   No plaque or stenosis.
IMPRESSION: 1. Coronary artery calcium score 0 Agatston units, suggesting low
risk for future cardiac events.

2.  No significant coronary disease identified.

Cynthia An Kevin Hatto

EXAM:
OVER-READ INTERPRETATION  CT CHEST

The following report is an over-read performed by radiologist Dr.
Yashar Morency [REDACTED] on 05/08/2018. This over-read
does not include interpretation of cardiac or coronary anatomy or
pathology. The coronary CTA interpretation by the cardiologist is
attached.
FINDINGS: Vascular: Heart is mildly enlarged. Visualized aorta normal caliber.

Mediastinum/Nodes: No adenopathy in the lower mediastinum or hila.

Lungs/Pleura: Linear scarring and/or atelectasis in the lower lobes.
No effusions.

Upper Abdomen: Imaging into the upper abdomen shows no acute
findings.

Musculoskeletal: Chest wall soft tissues are unremarkable. No acute
bony abnormality.
IMPRESSION: No acute extra cardiac abnormality.

Bibasilar scarring or atelectasis.

## 2019-09-10 ENCOUNTER — Ambulatory Visit: Payer: BC Managed Care – PPO | Admitting: Allergy

## 2019-09-10 ENCOUNTER — Encounter: Payer: Self-pay | Admitting: Allergy

## 2019-09-10 ENCOUNTER — Other Ambulatory Visit: Payer: Self-pay

## 2019-09-10 VITALS — BP 100/78 | HR 60 | Temp 98.6°F | Resp 10

## 2019-09-10 DIAGNOSIS — J3089 Other allergic rhinitis: Secondary | ICD-10-CM

## 2019-09-10 DIAGNOSIS — L299 Pruritus, unspecified: Secondary | ICD-10-CM

## 2019-09-10 NOTE — Progress Notes (Signed)
Follow-up Note  RE: James Kennedy MRN: [id number] DOB: Apr 06, 1961 Date of Office Visit: 09/10/2019   History of present illness: James Kennedy is a 58 y.o. male presenting today for follow-up of pruritus and allergic rhinitis.  He was last seen in the office on 06/18/2019 by myself.  There is no major health changes, surgeries or hospitalizations since the last visit. He states his age is somewhat better.  He has been sitting in his own leather chair at home thus decreasing his dog contact exposure which appears to be helpful.  He is using hydroxyzine as needed if he has itch at bedtime which does help when needed.  At this time not using any long-acting antihistamines.  He did try the Eucrisa samples however did not feel that it made much of a difference when he would apply it.  He is applying moisturizer that he got from [facility] after bathing. He states he does have some nasal congestion from time to time especially in the mornings after his using his CPAP.  He has not used any nasal steroid sprays since the last visit.  Review of systems: Review of Systems  Constitutional: Negative.   HENT: Positive for congestion. Negative for ear discharge, nosebleeds and sore throat.   Eyes: Negative.   Respiratory: Negative.   Cardiovascular: Negative.   Gastrointestinal: Negative.   Musculoskeletal: Negative.   Skin: Positive for itching. Negative for rash.  Neurological: Negative.     All other systems negative unless noted above in HPI  Past medical/social/surgical/family history have been reviewed and are unchanged unless specifically indicated below.  No changes  Medication List: Current Outpatient Medications  Medication Sig Dispense Refill  . Crisaborole (EUCRISA) 2 % OINT Apply 1 application topically 2 (two) times daily as needed. 60 g 5  . cyanocobalamin (,VITAMIN B-12,) 1000 MCG/ML injection INJECT 1 ML INTO THE MUSCLE EVERY 14 DAYS    . desonide (DESOWEN) 0.05 % cream apply  sparingly to affected area twice daily.    Marland Kitchen dutasteride (AVODART) 0.5 MG capsule TAKE ONE CAPSULE BY MOUTH DAILY    . enalapril (VASOTEC) 5 MG tablet TAKE ONE TABLET BY MOUTH DAILY    . EPINEPHrine 0.3 mg/0.3 mL IJ SOAJ injection Inject 0.3 mg into the muscle as needed.  For anaphylaxis    . hydrOXYzine (VISTARIL) 25 MG capsule Can take one capsule at bedtime if needed for itching. 30 capsule 5  . omeprazole (PRILOSEC) 40 MG capsule TAKE ONE CAPSULE BY MOUTH DAILY FOR REFLUX    . Saw Palmetto, Serenoa repens, (SAW PALMETTO PO) Take by mouth.    . tadalafil (CIALIS) 5 MG tablet TAKE ONE TABLET EVERY DAY    . tamsulosin (FLOMAX) 0.4 MG CAPS capsule Take 0.4 mg by mouth daily.  6  . triamcinolone (KENALOG) 0.1 % paste APPLY A THIN FILM TO AFFECTED AREA, PREFERABLY AT BEDTIME     No current facility-administered medications for this visit.     Known medication allergies: Allergies  Allergen Reactions  . Pentobarbital Sodium Other (See Comments)    hallucinations Other reaction(s): Mental Status Changes (intolerance) hallucinations  . Iron Itching and Rash    I.V. iron     Physical examination: Blood pressure 100/78, pulse 60, temperature 98.6 F (37 C), temperature source Temporal, resp. rate 10, SpO2 97 %.  General: Alert, interactive, in no acute distress. HEENT: PERRLA, TMs pearly gray, turbinates moderately edematous without discharge, post-pharynx non erythematous. Neck: Supple without lymphadenopathy. Lungs: Clear to  auscultation without wheezing, rhonchi or rales. {no increased work of breathing. CV: Normal S1, S2 without murmurs. Abdomen: Nondistended, nontender. Skin: Warm and dry, without lesions or rashes. Extremities:  No clubbing, cyanosis or edema. Neuro:   Grossly intact.  Diagnositics/Labs: Reviewed CBC w diff, LFTs and TSH all within normal limits  Assessment and plan:   Itching (Pruritus)  - continue avoidance measures for molds and dog  - common food  allergy skin testing was negative  - take long-acting antihistamine either Allegra 180mg , Zyrtec 10mg  or Xyzal 5mg  daily as needed  - continue use Hydroxyzine 25mg  as needed for nighttime itch control  - try Epiceram (samples provided today).  This is a moisturizing agent that can be used alone or layered with other medicated topical therapies.  Use 1-2 times a day for itch/rash control.   - daily moisturization is key.  Continue moisturization after showering  - labwork was reassuring  Nasal congestion (Allergic rhinitis)  - avoidance measures as above  - recommend use of nasal saline rinses to help clean/flush out the sinus passage  - use over-the-counter nasal steroid spray either Nasacort or Rhinocort for nasal congestion relief  Follow-up 4-6 months or sooner if needed  I appreciate the opportunity to take part in James Kennedy care. Please do not hesitate to contact me with questions.  Sincerely,   Prudy Feeler, MD Allergy/Immunology [facility]

## 2019-09-10 NOTE — Patient Instructions (Addendum)
Itching (Pruritus)  - continue avoidance measures for molds and dog  - common food allergy skin testing was negative  - take long-acting antihistamine either Allegra 180mg , Zyrtec 10mg  or Xyzal 5mg  daily as needed  - continue use Hydroxyzine 25mg  as needed for nighttime itch control  - try Epiceram (samples provided today).  This is a moisturizing agent that can be used alone or layered with other medicated topical therapies.  Use 1-2 times a day for itch/rash control.  Let us know if samples are effective and we can send in a prescription for you.    - daily moisturization is key.  Continue moisturization after showering  - labwork was reassuring  Nasal congestion (Allergic rhinitis)  - avoidance measures as above  - recommend use of nasal saline rinses to help clean/flush out the sinus passage  - use over-the-counter nasal steroid spray either Nasacort or Rhinocort for nasal congestion relief  Follow-up 4-6 months or sooner if needed
